# Patient Record
Sex: Male | Born: 1960 | Race: White | Hispanic: No | Marital: Married | Smoking: Never smoker
Health system: Southern US, Community
[De-identification: ages and names within clinical notes are randomized; demographics above are authoritative.]

---

## 2001-08-08 ENCOUNTER — Inpatient Hospital Stay (HOSPITAL_COMMUNITY): Admission: EM | Admit: 2001-08-08 | Discharge: 2001-08-10 | Payer: Self-pay | Admitting: Orthopedic Surgery

## 2004-12-23 ENCOUNTER — Ambulatory Visit: Payer: Self-pay | Admitting: Internal Medicine

## 2011-12-21 ENCOUNTER — Encounter: Payer: Self-pay | Admitting: Family Medicine

## 2011-12-21 ENCOUNTER — Ambulatory Visit (INDEPENDENT_AMBULATORY_CARE_PROVIDER_SITE_OTHER): Payer: BC Managed Care – PPO | Admitting: Family Medicine

## 2011-12-21 ENCOUNTER — Ambulatory Visit: Payer: BC Managed Care – PPO

## 2011-12-21 VITALS — BP 144/80 | HR 66 | Temp 98.1°F | Resp 16 | Ht 67.0 in | Wt 170.8 lb

## 2011-12-21 DIAGNOSIS — S93409A Sprain of unspecified ligament of unspecified ankle, initial encounter: Secondary | ICD-10-CM

## 2011-12-21 DIAGNOSIS — W19XXXA Unspecified fall, initial encounter: Secondary | ICD-10-CM

## 2011-12-21 DIAGNOSIS — M79671 Pain in right foot: Secondary | ICD-10-CM

## 2011-12-21 DIAGNOSIS — Z13228 Encounter for screening for other metabolic disorders: Secondary | ICD-10-CM

## 2011-12-21 DIAGNOSIS — Z23 Encounter for immunization: Secondary | ICD-10-CM

## 2011-12-21 DIAGNOSIS — Z130 Encounter for screening for diseases of the blood and blood-forming organs and certain disorders involving the immune mechanism: Secondary | ICD-10-CM

## 2011-12-21 DIAGNOSIS — M79609 Pain in unspecified limb: Secondary | ICD-10-CM

## 2011-12-21 NOTE — Progress Notes (Signed)
  Patient Name: Jordan Stout Date of Birth: 12-30-1960 Medical Record Number: [id number] Gender: male Date of Encounter: 12/21/2011  History of Present Illness:  Jordan Stout is a 51 y.o. very pleasant male patient who presents with the following:  Was going down his stairs on Saturday (today is Monday) when he tripped on his dog and fell 1/2 way down the flight of stairs.  Missed a step and injured his right foot.  Also banged and scraped his right arm but that is ok now.  No head injury.  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  No past medical history on file. No past surgical history on file. History  Substance Use Topics  . Smoking status: Never Smoker   . Smokeless tobacco: Not on file  . Alcohol Use: Not on file   No family history on file. No Known Allergies  Medication list has been reviewed and updated.  Review of Systems: As per HPI- otherwise negative.  Physical Examination: Filed Vitals:   12/21/11 1050  BP: 144/80  Pulse: 66  Temp: 98.1 F (36.7 C)  TempSrc: Oral  Resp: 16  Height: 5\' 7"  (1.702 m)  Weight: 170 lb 12.8 oz (77.474 kg)    Body mass index is 26.75 kg/(m^2).   GEN: WDWN, NAD, Non-toxic, Alert & Oriented x 3 HEENT: Atraumatic, Normocephalic.  Ears and Nose: No external deformity. EXTR: No clubbing/cyanosis/edema NEURO: Normal gait.  PSYCH: Normally interactive. Conversant. Not depressed or anxious appearing.  Calm demeanor.  Right arm- abrasion, otherwise negative Right ankle/ foot: tender and lateral epicondyle and at lateral foot, normal cap refill, ROM and sensation, normal DP pulse.  Some swelling but no bruising over tender area.   [facility] reading (PRIMARY) by  Dr. Patsy Lager.  Right foot: negative Right ankle: bony growth of  of tibia, but no acute fracture or other injury  Assessment and Plan: 1. Fall  DG Ankle Complete Right, DG Foot Complete Right  2. Need for diphtheria-tetanus-pertussis (Tdap) vaccine, adult/adolescent  Tdap vaccine greater than or equal to 7yo IM  3. Foot pain, right    4. Ankle sprain      Gave note for work- he plans to try and return on 3/21, but will let us know if this is not possible.  Use CAM as needed for pain with walking, elevate and ice ankle.  Let us know if not better within the next few days

## 2011-12-23 ENCOUNTER — Telehealth: Payer: Self-pay

## 2011-12-23 NOTE — Telephone Encounter (Signed)
pts foot is black and blue - needs oow note from Dr Patsy Lager

## 2012-03-22 ENCOUNTER — Other Ambulatory Visit: Payer: Self-pay | Admitting: Family Medicine

## 2012-05-25 ENCOUNTER — Other Ambulatory Visit: Payer: Self-pay | Admitting: Physician Assistant

## 2012-12-02 ENCOUNTER — Encounter: Payer: Self-pay | Admitting: Internal Medicine

## 2013-01-18 ENCOUNTER — Encounter: Payer: Self-pay | Admitting: Internal Medicine

## 2013-04-18 ENCOUNTER — Encounter: Payer: Self-pay | Admitting: Internal Medicine

## 2014-10-06 ENCOUNTER — Ambulatory Visit (INDEPENDENT_AMBULATORY_CARE_PROVIDER_SITE_OTHER): Payer: BLUE CROSS/BLUE SHIELD | Admitting: Family Medicine

## 2014-10-06 ENCOUNTER — Encounter: Payer: Self-pay | Admitting: Family Medicine

## 2014-10-06 VITALS — BP 132/78 | HR 68 | Temp 97.9°F | Ht 67.0 in | Wt 166.6 lb

## 2014-10-06 DIAGNOSIS — J029 Acute pharyngitis, unspecified: Secondary | ICD-10-CM

## 2014-10-06 DIAGNOSIS — J020 Streptococcal pharyngitis: Secondary | ICD-10-CM

## 2014-10-06 LAB — POCT RAPID STREP A (OFFICE): RAPID STREP A SCREEN: NEGATIVE

## 2014-10-06 NOTE — Addendum Note (Signed)
Addended by: Bronson Curb on: 10/06/2014 09:15 AM   Modules accepted: Orders

## 2014-10-06 NOTE — Progress Notes (Addendum)
[facility] [address], [place] [address] [phone]  Date:  10/06/2014   Name:  Jordan Stout   DOB:  1961-05-31   MRN:  [id number]  PCP:  Tally Due, MD    Chief Complaint: Sore Throat   History of Present Illness:  Jordan Stout is a 54 y.o. very pleasant male patient who presents with the following:  Here with his daughter who was dx with strep throat today.  He would like to be tested as well.  He also had noted a ST over the last few days, although he now seems to be improving. He is not aware of any fever, but he did "sweat it out" and felt achy and ill at bedtime for a couple of days earlier this week.  Overall now he feels ok.   He is otherwise generally healthy  There are no active problems to display for this patient.   History reviewed. No pertinent past medical history.  History reviewed. No pertinent past surgical history.  History  Substance Use Topics  . Smoking status: Never Smoker   . Smokeless tobacco: Former Neurosurgeon  . Alcohol Use: Yes     Comment: ocasionall    Family History  Problem Relation Age of Onset  . Heart disease Mother   . Hypertension Father     No Known Allergies  Medication list has been reviewed and updated.  Current Outpatient Prescriptions on File Prior to Visit  Medication Sig Dispense Refill  . lisinopril-hydrochlorothiazide (PRINZIDE,ZESTORETIC) 20-12.5 MG per tablet TAKE 1 TABLET BY MOUTH DAILY. NEEDS OFFICE VISIT/LABS FOR MORE 15 tablet 0   No current facility-administered medications on file prior to visit.    Review of Systems:  As per HPI- otherwise negative.   Physical Examination: Filed Vitals:   10/06/14 0854  BP: 132/78  Pulse: 68  Temp: 97.9 F (36.6 C)   Filed Vitals:   10/06/14 0854  Height:  (1.702 m)  Weight: 166 lb 9.6 oz (75.569 kg)   Body mass index is 26.09 kg/(m^2). Ideal Body Weight: Weight in (lb) to have BMI = 25: 159.3  GEN: WDWN, NAD,  Non-toxic, A & O x 3, looks well HEENT: Atraumatic, Normocephalic. Neck supple. No masses, No LAD.  Bilateral TM wnl, oropharynx normal.  PEERL,EOMI.   Ears and Nose: No external deformity. CV: RRR, No M/G/R. No JVD. No thrill. No extra heart sounds. PULM: CTA B, no wheezes, crackles, rhonchi. No retractions. No resp. distress. No accessory muscle use. EXTR: No c/c/e NEURO Normal gait.  PSYCH: Normally interactive. Conversant. Not depressed or anxious appearing.  Calm demeanor.   Results for orders placed or performed in visit on 10/06/14  POCT rapid strep A  Result Value Ref Range   Rapid Strep A Screen Negative Negative    Assessment and Plan: Acute pharyngitis, unspecified pharyngitis type - Plan: POCT rapid strep A  Exposed to strep through his daughter- however he is currently negative and does not have a current ST Await culture  Signed Abbe Amsterdam, MD  Called 1/3- had to [facility].   throat culture was positive after all.  Will send PCN to his drug store.  Let me know if not feeling better!

## 2014-10-06 NOTE — Patient Instructions (Signed)
I will be in touch with your culture- if positive we will call in penicillin for you but otherwise it appear that you are in the clear.  Let me know if you have any further symptoms!

## 2014-10-07 ENCOUNTER — Encounter: Payer: Self-pay | Admitting: Family Medicine

## 2014-10-07 LAB — CULTURE, GROUP A STREP

## 2014-10-07 MED ORDER — PENICILLIN V POTASSIUM 500 MG PO TABS: 500.0000 mg | ORAL_TABLET | Freq: Two times a day (BID) | ORAL | Status: AC

## 2014-10-07 NOTE — Addendum Note (Signed)
Addended by: Abbe Amsterdam C on: 10/07/2014 05:51 PM   Modules accepted: Orders

## 2017-12-14 ENCOUNTER — Ambulatory Visit (INDEPENDENT_AMBULATORY_CARE_PROVIDER_SITE_OTHER): Payer: BLUE CROSS/BLUE SHIELD | Admitting: Orthopaedic Surgery

## 2017-12-14 ENCOUNTER — Ambulatory Visit (INDEPENDENT_AMBULATORY_CARE_PROVIDER_SITE_OTHER): Payer: BLUE CROSS/BLUE SHIELD

## 2017-12-14 ENCOUNTER — Encounter (INDEPENDENT_AMBULATORY_CARE_PROVIDER_SITE_OTHER): Payer: Self-pay | Admitting: Orthopaedic Surgery

## 2017-12-14 DIAGNOSIS — M25552 Pain in left hip: Secondary | ICD-10-CM | POA: Diagnosis not present

## 2017-12-14 NOTE — Progress Notes (Signed)
   Office Visit Note   Patient: Jordan Stout           Date of Birth: Feb 03, 1961           MRN: [id number] Visit Date: 12/14/2017              Requested by: Jonita AlbeeGuest, Chris W, MD No address on file PCP: Jonita AlbeeGuest, Chris W, MD   Assessment & Plan: Visit Diagnoses:  1. 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  At this point, we feel it is appropriate to refer him to Dr. Alvester MorinNewton for an intra-articular cortisone injection.  He will follow-up with us on an as-needed basis.  He will call with concerns or questions in the meantime.  Follow-Up Instructions: Return if symptoms worsen or fail to improve.   Orders:  Orders Placed This Encounter  Procedures  . XR HIP UNILAT W OR W/O PELVIS 2-3 VIEWS LEFT  . Ambulatory referral to Physical Medicine Rehab   No orders of the defined types were placed in this encounter.     Procedures: No procedures performed   Clinical Data: No additional findings.   Subjective: Chief Complaint  Patient presents with  . Left Hip - Pain    HPI Jordan HuaDavid is a pleasant 57 year old gentleman who presents to our clinic today with left hip pain.  This began about a month and a half ago without any known injury or change in activity.  All of his pain is to the groin.  No anterior thigh or posterior leg pain.  No pain over the greater trochanter.  He describes the pain is sharp and shooting in nature and only occurs intermittently.  Thinks that bring this on when he turns certain ways or crosses his legs.  No numbness tingling burning.  Review of Systems as detailed in HPI.  All others reviewed and are negative.   Objective: Vital Signs: There were no vitals taken for this visit.  Physical Exam well-developed well-nourished gentleman in no acute distress.  Alert and oriented x3.  Ortho Exam examination of his left hip reveals negative straight leg raise, positive logroll and markedly positive Pearlean BrownieFaber.  He is  neurovascular intact distally.  Specialty Comments:  No specialty comments available.  Imaging: Xr Hip Unilat W Or W/o Pelvis 2-3 Views Left  Result Date: 12/14/2017 X-rays of the left hip reveal minimal degenerative changes    PMFS History: There are no active problems to display for this patient.  History reviewed. No pertinent past medical history.  Family History  Problem Relation Age of Onset  . Heart disease Mother   . Hypertension Father     History reviewed. No pertinent surgical history. Social History   Occupational History  . Not on file  Tobacco Use  . Smoking status: Never Smoker  . Smokeless tobacco: Former Engineer, waterUser  Substance and Sexual Activity  . Alcohol use: Yes    Comment: ocasionall  . Drug use: No  . Sexual activity: Not on file

## 2018-01-06 ENCOUNTER — Encounter (INDEPENDENT_AMBULATORY_CARE_PROVIDER_SITE_OTHER): Payer: Self-pay | Admitting: Physical Medicine and Rehabilitation

## 2018-01-06 ENCOUNTER — Ambulatory Visit (INDEPENDENT_AMBULATORY_CARE_PROVIDER_SITE_OTHER): Payer: Self-pay

## 2018-01-06 ENCOUNTER — Ambulatory Visit (INDEPENDENT_AMBULATORY_CARE_PROVIDER_SITE_OTHER): Payer: BLUE CROSS/BLUE SHIELD | Admitting: Physical Medicine and Rehabilitation

## 2018-01-06 DIAGNOSIS — M25552 Pain in left hip: Secondary | ICD-10-CM | POA: Diagnosis not present

## 2018-01-06 MED ORDER — BUPIVACAINE HCL 0.5 % IJ SOLN
3.0000 mL | INTRAMUSCULAR | Status: AC | PRN
  Administered 2018-01-06: 3 mL via INTRA_ARTICULAR

## 2018-01-06 MED ORDER — TRIAMCINOLONE ACETONIDE 40 MG/ML IJ SUSP
80.0000 mg | INTRAMUSCULAR | Status: AC | PRN
  Administered 2018-01-06: 80 mg via INTRA_ARTICULAR

## 2018-01-06 NOTE — Patient Instructions (Signed)

## 2018-01-06 NOTE — Progress Notes (Signed)
   Jordan EtienneDavid L Kingbird - 57 y.o. male MRN [id number]  Date of birth: 10/03/1961  Office Visit Note: Visit Date: 01/06/2018 PCP: Jonita AlbeeGuest, Chris W, MD Referred by: Jonita AlbeeGuest, Chris W, MD  Subjective: Chief Complaint  Patient presents with  . Left Hip - Pain   HPI: Mr. Jordan Stout is a 57 year old gentleman with chronic worsening left hip and groin pain particularly in the left buttock and left lateral hip but also in the anterior part with certain movements.  He reports this started about 2 months ago without specific injury.  He states the pain can come and go some more night and is really dependent on the way he steps.  He uses heating pad and medications with only mild relief.  He is referred by Dr. Roda ShuttersXu for diagnostic and therapeutic anesthetic hip arthrogram.   ROS Otherwise per HPI.  Assessment & Plan: Visit Diagnoses:  1. Pain in left hip     Plan: Findings:  Diagnostic and therapeutic anesthetic hip arthrogram.  Patient seemed to have some relief anesthetic phase of the injection.  He had increased range of motion.    Meds & Orders: No orders of the defined types were placed in this encounter.  No orders of the defined types were placed in this encounter.   Follow-up: No follow-ups on file.   Procedures: Large Joint Inj: L hip joint on 01/06/2018 1:44 PM Indications: pain and diagnostic evaluation Details: 22 G needle, anterior approach  Arthrogram: Yes  Medications: 80 mg triamcinolone acetonide 40 MG/ML; 3 mL bupivacaine 0.5 % Outcome: tolerated well, no immediate complications  Arthrogram demonstrated excellent flow of contrast throughout the joint surface without extravasation or obvious defect.  The patient had relief of symptoms during the anesthetic phase of the injection.  Procedure, treatment alternatives, risks and benefits explained, specific risks discussed. Consent was given by the patient. Immediately prior to procedure a time out was called to verify the correct patient,  procedure, equipment, support staff and site/side marked as required. Patient was prepped and draped in the usual sterile fashion.      No notes on file   Clinical History: No specialty comments available.   He reports that he has never smoked. He has quit using smokeless tobacco. No results for input(s): HGBA1C, LABURIC in the last 8760 hours.  Objective:  VS:  HT:    WT:   BMI:     BP:   HR: bpm  TEMP: ( )  RESP:  Physical Exam  Ortho Exam Imaging: No results found.  Past Medical/Family/Surgical/Social History: Medications & Allergies reviewed per EMR, new medications updated. There are no active problems to display for this patient.  History reviewed. No pertinent past medical history. Family History  Problem Relation Age of Onset  . Heart disease Mother   . Hypertension Father    History reviewed. No pertinent surgical history. Social History   Occupational History  . Not on file  Tobacco Use  . Smoking status: Never Smoker  . Smokeless tobacco: Former Engineer, waterUser  Substance and Sexual Activity  . Alcohol use: Yes    Comment: ocasionall  . Drug use: No  . Sexual activity: Not on file

## 2018-01-06 NOTE — Progress Notes (Signed)
 .  Numeric Pain Rating Scale and Functional Assessment Average Pain 8   In the last MONTH (on 0-10 scale) has pain interfered with the following?  1. General activity like being  able to carry out your everyday physical activities such as walking, climbing stairs, carrying groceries, or moving a chair?  Rating(4)   +Driver, -BT, -Dye Allergies.  

## 2018-03-23 ENCOUNTER — Telehealth (INDEPENDENT_AMBULATORY_CARE_PROVIDER_SITE_OTHER): Payer: Self-pay | Admitting: Physician Assistant

## 2018-03-23 NOTE — Telephone Encounter (Signed)
[facility] [phone]

## 2019-04-26 ENCOUNTER — Ambulatory Visit (HOSPITAL_COMMUNITY)
Admission: RE | Admit: 2019-04-26 | Discharge: 2019-04-26 | Disposition: A | Discharge status: Home / Self Care | Payer: BC Managed Care – PPO | Source: Ambulatory Visit | Attending: Family | Admitting: Family

## 2019-04-26 ENCOUNTER — Other Ambulatory Visit: Payer: Self-pay

## 2019-04-26 ENCOUNTER — Telehealth (HOSPITAL_COMMUNITY): Payer: Self-pay | Admitting: Rehabilitation

## 2019-04-26 ENCOUNTER — Other Ambulatory Visit (HOSPITAL_COMMUNITY): Payer: Self-pay | Admitting: Internal Medicine

## 2019-04-26 DIAGNOSIS — M79605 Pain in left leg: Secondary | ICD-10-CM | POA: Diagnosis present

## 2019-04-26 NOTE — Telephone Encounter (Signed)

## 2019-06-15 ENCOUNTER — Other Ambulatory Visit: Payer: Self-pay | Admitting: Internal Medicine

## 2019-06-15 DIAGNOSIS — E785 Hyperlipidemia, unspecified: Secondary | ICD-10-CM

## 2019-06-20 ENCOUNTER — Telehealth (HOSPITAL_COMMUNITY): Payer: Self-pay | Admitting: *Deleted

## 2019-06-20 NOTE — Telephone Encounter (Signed)
Let VM for pt to schedule appt. EM

## 2019-07-06 ENCOUNTER — Other Ambulatory Visit: Payer: BC Managed Care – PPO

## 2019-07-13 ENCOUNTER — Other Ambulatory Visit: Payer: BC Managed Care – PPO

## 2020-02-06 ENCOUNTER — Other Ambulatory Visit (HOSPITAL_COMMUNITY): Payer: Self-pay | Admitting: Internal Medicine

## 2020-02-06 ENCOUNTER — Ambulatory Visit (HOSPITAL_COMMUNITY)
Admission: RE | Admit: 2020-02-06 | Discharge: 2020-02-06 | Disposition: A | Discharge status: Home / Self Care | Payer: BC Managed Care – PPO | Source: Ambulatory Visit | Attending: Surgery | Admitting: Surgery

## 2020-02-06 ENCOUNTER — Other Ambulatory Visit: Payer: Self-pay

## 2020-02-06 ENCOUNTER — Telehealth (HOSPITAL_COMMUNITY): Payer: Self-pay | Admitting: *Deleted

## 2020-02-06 DIAGNOSIS — M79605 Pain in left leg: Secondary | ICD-10-CM | POA: Insufficient documentation

## 2020-02-06 NOTE — Telephone Encounter (Signed)
Office notified of results, patient instructed by office to return following exam.

## 2020-03-19 ENCOUNTER — Encounter: Payer: Self-pay | Admitting: Vascular Surgery

## 2020-03-19 ENCOUNTER — Other Ambulatory Visit: Payer: Self-pay

## 2020-03-19 ENCOUNTER — Ambulatory Visit: Payer: BC Managed Care – PPO | Admitting: Vascular Surgery

## 2020-03-19 VITALS — BP 154/95 | HR 78 | Temp 98.3°F | Resp 20 | Ht 67.0 in | Wt 181.8 lb

## 2020-03-19 DIAGNOSIS — I82402 Acute embolism and thrombosis of unspecified deep veins of left lower extremity: Secondary | ICD-10-CM

## 2020-03-19 NOTE — Progress Notes (Signed)
[facility]  Patient name: Jordan Stout MRN: [id number] DOB: Sep 03, 1961 Sex: male  REASON FOR CONSULT: Discuss the duplex findings of left leg DVT  HPI: Jordan Stout is a 59 y.o. male, who is here today for discussion of left leg DVT.  He initially had an episode of superficial thrombophlebitis involving left great saphenous vein in his proximal thigh.  He had soreness and swelling over this area and had a duplex which revealed great saphenous vein thrombus that extended up to between 2 and 3 cm from the saphenofemoral junction.  Reports that he was placed on Eliquis for approximately 45 days and then discontinued.  This was in July 2020.  Recently he was having symptoms again in his left hip and thigh and had repeat duplex for further evaluation.  His symptoms do not sound related to venous disease.  He reports that he will have days when he has no issues whatsoever and then will have a stabbing pain in his lateral hip and this extends around into his anterior thigh.  He reports that he will have discomfort for several days following this and then this resolves.  He specifically denies any other history of DVT and does not have any lower extremity swelling.  History reviewed. No pertinent past medical history.  Family History  Problem Relation Age of Onset  . Heart disease Mother   . Hypertension Father     SOCIAL HISTORY: Social History   Socioeconomic History  . Marital status: Single    Spouse name: Not on file  . Number of children: Not on file  . Years of education: Not on file  . Highest education level: Not on file  Occupational History  . Not on file  Tobacco Use  . Smoking status: Never Smoker  . Smokeless tobacco: Current User    Types: Snuff  Substance and Sexual Activity  . Alcohol use: Yes    Comment: ocasionall  . Drug use: No  . Sexual activity: Not on file  Other Topics Concern  . Not on  file  Social History Narrative  . Not on file   Social Determinants of Health   Financial Resource Strain:   . Difficulty of Paying Living Expenses:   Food Insecurity:   . Worried About Programme researcher, broadcasting/film/video in the Last Year:   . Barista in the Last Year:   Transportation Needs:   . Freight forwarder (Medical):   Marland Kitchen Lack of Transportation (Non-Medical):   Physical Activity:   . Days of Exercise per Week:   . Minutes of Exercise per Session:   Stress:   . Feeling of Stress :   Social Connections:   . Frequency of Communication with Friends and Family:   . Frequency of Social Gatherings with Friends and Family:   . Attends Religious Services:   . Active Member of Clubs or Organizations:   . Attends Banker Meetings:   Marland Kitchen Marital Status:   Intimate Partner Violence:   . Fear of Current or Ex-Partner:   . Emotionally Abused:   Marland Kitchen Physically Abused:   . Sexually Abused:     No Known Allergies  Current Outpatient Medications  Medication Sig Dispense Refill  . Ibuprofen 200 MG CAPS Take by mouth.    Marland Kitchen lisinopril-hydrochlorothiazide (PRINZIDE,ZESTORETIC) 20-12.5 MG per tablet TAKE 1 TABLET BY MOUTH DAILY. NEEDS OFFICE VISIT/LABS FOR MORE 15 tablet 0  . penicillin v potassium (  VEETID) 500 MG tablet Take 1 tablet (500 mg total) by mouth 2 (two) times daily. (Patient not taking: Reported on 01/06/2018) 20 tablet 0   No current facility-administered medications for this visit.    REVIEW OF SYSTEMS:  [X]  denotes positive finding, [ ]  denotes negative finding Cardiac  Comments:  Chest pain or chest pressure:    Shortness of breath upon exertion:    Short of breath when lying flat:    Irregular heart rhythm:        Vascular    Pain in calf, thigh, or hip brought on by ambulation: x   Pain in feet at night that wakes you up from your sleep:     Blood clot in your veins:    Leg swelling:         Pulmonary    Oxygen at home:    Productive cough:       Wheezing:         Neurologic    Sudden weakness in arms or legs:     Sudden numbness in arms or legs:     Sudden onset of difficulty speaking or slurred speech:    Temporary loss of vision in one eye:     Problems with dizziness:         Gastrointestinal    Blood in stool:     Vomited blood:         Genitourinary    Burning when urinating:     Blood in urine:        Psychiatric    Major depression:         Hematologic    Bleeding problems:    Problems with blood clotting too easily:        Skin    Rashes or ulcers:        Constitutional    Fever or chills:      PHYSICAL EXAM: Vitals:   03/19/20 1016  BP: (!) 154/95  Pulse: 78  Resp: 20  Temp: 98.3 F (36.8 C)  TempSrc: Temporal  SpO2: 99%  Weight: 181 lb 12.8 oz (82.5 kg)  Height: 5\' 7"  (1.702 m)    GENERAL: The patient is a well-nourished male, in no acute distress. The vital signs are documented above. CARDIOVASCULAR: 2+ radial and 2+ dorsalis pedis pulses bilaterally.  No swelling bilaterally. PULMONARY: There is good air exchange  ABDOMEN: Soft and non-tender  MUSCULOSKELETAL: There are no major deformities or cyanosis. NEUROLOGIC: No focal weakness or paresthesias are detected. SKIN: There are no ulcers or rashes noted.  Does have some distal cyanosis in his feet which he reports is chronic PSYCHIATRIC: The patient has a normal affect.  DATA:  Duplex was reviewed from 04/2019 and also from May of this year.  He did not have any evidence of DVT by duplex in 2020.  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  MEDICAL ISSUES: I discussed this at length with the patient.  I suspect that he did have some nonocclusive thrombus at the femoral vein near the saphenofemoral junction when he had the superficial great saphenous vein thrombus in his proximal thigh.  He does not have any evidence of swelling or evidence of recurrent  thrombosis.  I would not recommend long-term anticoagulation.  Would not recommend any further hypercoagulable work-up with a single episode.  I do not feel that his left hip symptoms correlate with venous  pathology more likely primarily hip or even potentially lumbar disc disease.  He was reassured with this discussion will see [place] again on an as-needed basis   Larina Earthly, MD [facility] Tel [phone] Pager [phone]

## 2020-08-14 ENCOUNTER — Other Ambulatory Visit: Payer: Self-pay | Admitting: Internal Medicine

## 2020-08-14 DIAGNOSIS — E785 Hyperlipidemia, unspecified: Secondary | ICD-10-CM

## 2021-01-26 ENCOUNTER — Emergency Department (HOSPITAL_COMMUNITY)
Admission: EM | Admit: 2021-01-26 | Discharge: 2021-01-27 | Disposition: A | Discharge status: Home / Self Care | Payer: BC Managed Care – PPO | Attending: Emergency Medicine | Admitting: Emergency Medicine

## 2021-01-26 ENCOUNTER — Emergency Department (HOSPITAL_COMMUNITY): Payer: BC Managed Care – PPO

## 2021-01-26 DIAGNOSIS — F17220 Nicotine dependence, chewing tobacco, uncomplicated: Secondary | ICD-10-CM | POA: Insufficient documentation

## 2021-01-26 DIAGNOSIS — W19XXXA Unspecified fall, initial encounter: Secondary | ICD-10-CM

## 2021-01-26 DIAGNOSIS — Y92838 Other recreation area as the place of occurrence of the external cause: Secondary | ICD-10-CM | POA: Diagnosis not present

## 2021-01-26 DIAGNOSIS — S01111A Laceration without foreign body of right eyelid and periocular area, initial encounter: Secondary | ICD-10-CM | POA: Diagnosis not present

## 2021-01-26 DIAGNOSIS — S0101XA Laceration without foreign body of scalp, initial encounter: Secondary | ICD-10-CM | POA: Insufficient documentation

## 2021-01-26 DIAGNOSIS — F10920 Alcohol use, unspecified with intoxication, uncomplicated: Secondary | ICD-10-CM

## 2021-01-26 DIAGNOSIS — W108XXA Fall (on) (from) other stairs and steps, initial encounter: Secondary | ICD-10-CM | POA: Insufficient documentation

## 2021-01-26 DIAGNOSIS — S0990XA Unspecified injury of head, initial encounter: Secondary | ICD-10-CM | POA: Diagnosis present

## 2021-01-26 DIAGNOSIS — F10929 Alcohol use, unspecified with intoxication, unspecified: Secondary | ICD-10-CM | POA: Diagnosis not present

## 2021-01-26 MED ORDER — TETANUS-DIPHTH-ACELL PERTUSSIS 5-2.5-18.5 LF-MCG/0.5 IM SUSY: 0.5000 mL | PREFILLED_SYRINGE | Freq: Once | INTRAMUSCULAR | Status: DC

## 2021-01-26 MED ORDER — LIDOCAINE-EPINEPHRINE (PF) 2 %-1:200000 IJ SOLN
20.0000 mL | Freq: Once | INTRAMUSCULAR | Status: AC
  Administered 2021-01-26: 20 mL
  Filled 2021-01-26: qty 20

## 2021-01-26 NOTE — ED Triage Notes (Signed)
Pt BIB EMS from food truck festival. Admits to drinking 8 beers. Pt fell down 3 steps at festival. No LOC. C/o pain to top of head. Small laceration noted to R side of forehead and laceration noted to top of head. Pt not on blood thinners. Takes lisinopril. A&O x4  EMS vitals: HR 90 BP 164/102 97% RA CBG 107   18G IV in R AC

## 2021-01-26 NOTE — ED Provider Notes (Signed)
[facility] EMERGENCY DEPARTMENT Provider Note   CSN: [id number] Arrival date & time: 01/26/21  2200     History No chief complaint on file.   Jordan Stout is a 60 y.o. male.  Patient presents to the emergency department with a chief complaint of fall and head injury.  He is intoxicated.  He states that he was at a bar and had 8 beers.  He missed a step and hit his head on the stairs.  He sustained a large scalp laceration and small right eyebrow laceration.  Unknown loss of consciousness.  He is not anticoagulated.  He denies being in any pain elsewhere.  The history is provided by the patient. No language interpreter was used.       No past medical history on file.  There are no problems to display for this patient.   No past surgical history on file.     Family History  Problem Relation Age of Onset  . Heart disease Mother   . Hypertension Father     Social History   Tobacco Use  . Smoking status: Never Smoker  . Smokeless tobacco: Current User    Types: Snuff  Substance Use Topics  . Alcohol use: Yes    Comment: ocasionall  . Drug use: No    Home Medications Prior to Admission medications   Medication Sig Start Date End Date Taking? Authorizing Provider  Ibuprofen 200 MG CAPS Take by mouth. 09/14/16   [provider]  lisinopril-hydrochlorothiazide (PRINZIDE,ZESTORETIC) 20-12.5 MG per tablet TAKE 1 TABLET BY MOUTH DAILY. NEEDS OFFICE VISIT/LABS FOR MORE 05/25/12   Rhoderick Moody M, PA-C  penicillin v potassium (VEETID) 500 MG tablet Take 1 tablet (500 mg total) by mouth 2 (two) times daily. Patient not taking: Reported on 01/06/2018 10/07/14   Copland, Gwenlyn Found, MD    Allergies    Patient has no known allergies.  Review of Systems   Review of Systems  All other systems reviewed and are negative.   Physical Exam Updated Vital Signs BP (!) 146/94 (BP Location: Right Arm)   Pulse 87   Temp 97.6 F (36.4 C) (Oral)   Resp  18   Ht 5\' 7"  (1.702 m)   Wt 81.6 kg   SpO2 97%   BMI 28.19 kg/m   Physical Exam Vitals and nursing note reviewed.  Constitutional:      Appearance: He is well-developed.  HENT:     Head: Normocephalic and atraumatic.     Comments: 6 cm semi circular laceration to the scalp 3 cm stellate laceration to right temple Eyes:     Conjunctiva/sclera: Conjunctivae normal.  Cardiovascular:     Rate and Rhythm: Normal rate and regular rhythm.     Heart sounds: No murmur heard.   Pulmonary:     Effort: Pulmonary effort is normal. No respiratory distress.     Breath sounds: Normal breath sounds.  Abdominal:     Palpations: Abdomen is soft.     Tenderness: There is no abdominal tenderness.  Musculoskeletal:        General: Normal range of motion.     Cervical back: Neck supple.     Comments: Moves all extremities  Skin:    General: Skin is warm and dry.  Neurological:     Mental Status: He is alert and oriented to person, place, and time.     Comments: CN 3-12 intact, speech is clear, movements are goal oriented, equal grip strength  bilaterally  Psychiatric:        Mood and Affect: Mood normal.        Behavior: Behavior normal.     ED Results / Procedures / Treatments   Labs (all labs ordered are listed, but only abnormal results are displayed) Labs Reviewed - No data to display  EKG None  Radiology No results found.  Procedures .Marland KitchenLaceration Repair  Date/Time: 01/27/2021 12:10 AM Performed by: Roxy Horseman, PA-C Authorized by: Roxy Horseman, PA-C   Consent:    Consent obtained:  Verbal   Consent given by:  Patient   Risks discussed:  Infection, need for additional repair, pain, poor cosmetic result and poor wound healing   Alternatives discussed:  No treatment and delayed treatment Universal protocol:    Procedure explained and questions answered to patient or proxy's satisfaction: yes     Relevant documents present and verified: yes     Test results  available: yes     Imaging studies available: yes     Required blood products, implants, devices, and special equipment available: yes     Site/side marked: yes     Immediately prior to procedure, a time out was called: yes     Patient identity confirmed:  Verbally with patient Anesthesia:    Anesthesia method:  Local infiltration   Local anesthetic:  Lidocaine 1% WITH epi Laceration details:    Location:  Scalp   Scalp location:  R temporal   Length (cm):  3 Pre-procedure details:    Preparation:  Patient was prepped and draped in usual sterile fashion and imaging obtained to evaluate for foreign bodies Exploration:    Wound exploration: wound explored through full range of motion and entire depth of wound visualized     Contaminated: no   Treatment:    Area cleansed with:  Saline   Amount of cleaning:  Standard   Irrigation solution:  Sterile water and sterile saline   Irrigation method:  Syringe   Debridement:  None Skin repair:    Repair method:  Sutures   Suture size:  5-0   Suture material:  Prolene   Number of sutures:  5 Approximation:    Approximation:  Close Repair type:    Repair type:  Simple Post-procedure details:    Dressing:  Open (no dressing)   Procedure completion:  Tolerated well, no immediate complications .Marland KitchenLaceration Repair  Date/Time: 01/27/2021 12:11 AM Performed by: Roxy Horseman, PA-C Authorized by: Roxy Horseman, PA-C   Consent:    Consent obtained:  Verbal   Consent given by:  Patient   Risks discussed:  Infection, need for additional repair, pain, poor cosmetic result and poor wound healing   Alternatives discussed:  No treatment and delayed treatment Universal protocol:    Procedure explained and questions answered to patient or proxy's satisfaction: yes     Relevant documents present and verified: yes     Test results available: yes     Imaging studies available: yes     Required blood products, implants, devices, and special  equipment available: yes     Site/side marked: yes     Immediately prior to procedure, a time out was called: yes     Patient identity confirmed:  Verbally with patient Anesthesia:    Anesthesia method:  Local infiltration Laceration details:    Location:  Scalp   Length (cm):  6   Laceration depth: full thickness. Pre-procedure details:    Preparation:  Patient was prepped and draped  in usual sterile fashion Exploration:    Wound exploration: wound explored through full range of motion and entire depth of wound visualized   Treatment:    Area cleansed with:  Saline   Amount of cleaning:  Standard   Irrigation solution:  Sterile saline   Irrigation method:  Syringe   Layers/structures repaired:  Vernona Rieger:    Suture size:  5-0   Suture material:  Plain gut   Suture technique:  Simple interrupted   Number of sutures:  4 Skin repair:    Repair method:  Staples   Number of staples:  19 Approximation:    Approximation:  Close Repair type:    Repair type:  Intermediate Post-procedure details:    Dressing:  Open (no dressing)   Procedure completion:  Tolerated well, no immediate complications     Medications Ordered in ED Medications  lidocaine-EPINEPHrine (XYLOCAINE W/EPI) 2 %-1:200000 (PF) injection 20 mL (has no administration in time range)    ED Course  I have reviewed the triage vital signs and the nursing notes.  Pertinent labs & imaging results that were available during my care of the patient were reviewed by me and considered in my medical decision making (see chart for details).    MDM Rules/Calculators/A&P                          Patient here with scalp laceration.  He is intoxicated and fell and hit his head on a step.  He does not recall passing out.  CT cervical spine and head are negative for intracranial process or skull fracture or cervical spine fracture.  Due to intoxication, patient is placed in a [place] J collar and instructed to follow-up with  his PCP.  Patient did have a deep scalp laceration and a minor forehead laceration which were repaired, see notes above.  Tetanus shot is current.  Patient moves all extremities.  He is neurovascularly intact.  He ambulates to the bathroom with a steady gait.   Final Clinical Impression(s) / ED Diagnoses Final diagnoses:  Fall, initial encounter  Laceration of scalp, initial encounter  Alcoholic intoxication without complication [facility])    Rx / DC Orders ED Discharge Orders    None       Roxy Horseman, PA-C 01/27/21 0016    Cheryll Cockayne, MD 01/27/21 [phone]

## 2021-01-27 NOTE — ED Notes (Signed)
E-signature pad unavailable at time of pt discharge. This RN discussed discharge materials with pt and answered all pt questions. Pt stated understanding of discharge material. ? ?

## 2021-01-27 NOTE — Discharge Instructions (Addendum)
You need to follow-up with your doctor in 5 days for suture removal.  You need to follow-up with your doctor in 10-14 days for staple removal.  You should keep the soft collar on until you see your doctor in 5 days.   Keep the wounds clean and dry for the first 24 hours.  After that, you can shower, but not bathe.  I don't want you to soak the wound under water, but showering is ok.    If you have redness or pus that forms, you will need to return to the ER.

## 2021-08-18 ENCOUNTER — Other Ambulatory Visit (HOSPITAL_COMMUNITY): Payer: Self-pay | Admitting: Internal Medicine

## 2021-08-18 ENCOUNTER — Other Ambulatory Visit: Payer: Self-pay | Admitting: Internal Medicine

## 2021-08-18 DIAGNOSIS — M545 Low back pain, unspecified: Secondary | ICD-10-CM

## 2021-08-18 DIAGNOSIS — M546 Pain in thoracic spine: Secondary | ICD-10-CM

## 2021-08-20 ENCOUNTER — Other Ambulatory Visit: Payer: Self-pay

## 2021-08-20 ENCOUNTER — Ambulatory Visit (HOSPITAL_COMMUNITY)
Admission: RE | Admit: 2021-08-20 | Discharge: 2021-08-20 | Disposition: A | Discharge status: Home / Self Care | Payer: BC Managed Care – PPO | Source: Ambulatory Visit | Attending: Internal Medicine | Admitting: Internal Medicine

## 2021-08-20 DIAGNOSIS — M545 Low back pain, unspecified: Secondary | ICD-10-CM | POA: Diagnosis present

## 2021-08-20 DIAGNOSIS — M546 Pain in thoracic spine: Secondary | ICD-10-CM | POA: Diagnosis not present

## 2021-08-20 MED ORDER — GADOBUTROL 1 MMOL/ML IV SOLN
8.5000 mL | Freq: Once | INTRAVENOUS | Status: AC | PRN
  Administered 2021-08-20: 8.5 mL via INTRAVENOUS

## 2021-08-25 ENCOUNTER — Other Ambulatory Visit: Payer: Self-pay | Admitting: Internal Medicine

## 2021-08-25 ENCOUNTER — Ambulatory Visit
Admission: RE | Admit: 2021-08-25 | Discharge: 2021-08-25 | Disposition: A | Discharge status: Home / Self Care | Payer: BC Managed Care – PPO | Source: Ambulatory Visit | Attending: Internal Medicine | Admitting: Internal Medicine

## 2021-08-25 DIAGNOSIS — R1031 Right lower quadrant pain: Secondary | ICD-10-CM

## 2021-08-25 MED ORDER — IOPAMIDOL (ISOVUE-300) INJECTION 61%
100.0000 mL | Freq: Once | INTRAVENOUS | Status: AC | PRN
  Administered 2021-08-25: 100 mL via INTRAVENOUS

## 2021-09-01 ENCOUNTER — Telehealth: Payer: Self-pay | Admitting: Physician Assistant

## 2021-09-01 NOTE — Telephone Encounter (Signed)
Scheduled appt per 11/14 referral. Pt is aware of appt date and time.  

## 2021-09-23 ENCOUNTER — Other Ambulatory Visit: Payer: Self-pay | Admitting: Internal Medicine

## 2021-09-23 DIAGNOSIS — E785 Hyperlipidemia, unspecified: Secondary | ICD-10-CM

## 2021-10-07 ENCOUNTER — Other Ambulatory Visit: Payer: BC Managed Care – PPO

## 2021-10-07 ENCOUNTER — Encounter: Payer: BC Managed Care – PPO | Admitting: Physician Assistant

## 2021-10-27 ENCOUNTER — Other Ambulatory Visit: Payer: BC Managed Care – PPO

## 2022-08-23 IMAGING — MR MR LUMBAR SPINE WO/W CM
4 of 7 series · 28 of 48 positions shown · IV contrast (gadavist)
Comparison: None.

CLINICAL DATA: Acute right-sided thoracic back pain radiating to
right groin 2 weeks no trauma

EXAM:
MRI THORACIC AND LUMBAR SPINE WITHOUT AND WITH CONTRAST
TECHNIQUE: Multiplanar and multiecho pulse sequences of the thoracic and lumbar
spine were obtained without and with intravenous contrast.
CONTRAST:  8.5mL GADAVIST GADOBUTROL 1 MMOL/ML IV SOLN

[Series 16: T1 · sagittal · 4.0mm · 0.81mm/px · 3 of 15 slices shown (1 of 2)]
[im 1/15]
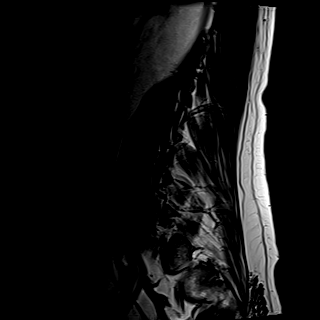
[im 8/15]
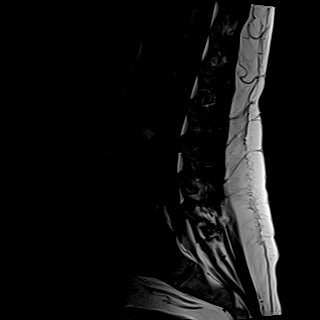
[im 15/15]
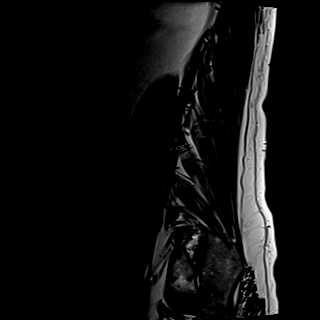

[Series 17: T2 · sagittal · 4.0mm · 0.81mm/px · 4 of 15 slices shown (1 of 2)]
[im 1/15]
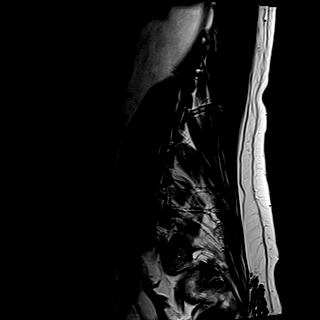
[im 5/15]
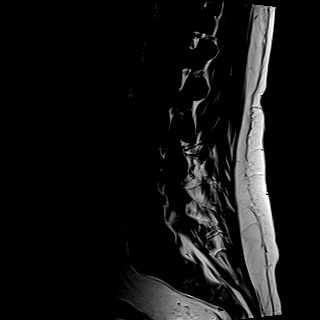
[im 10/15]
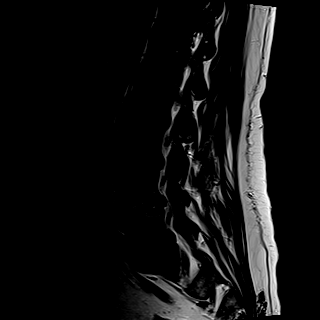
[im 15/15]
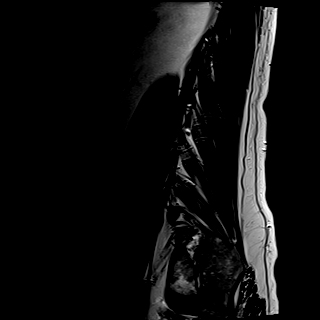

[Series 19: T2 · axial · 4.0mm · 0.62mm/px · z∈[-545,-321]mm · 11 of 43 slices shown (2 of 2)]
[im 1/43]
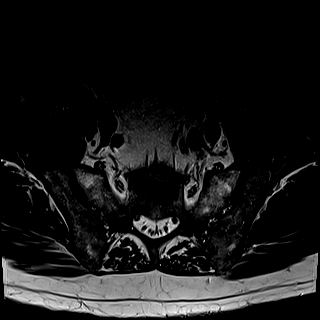
[im 5/43]
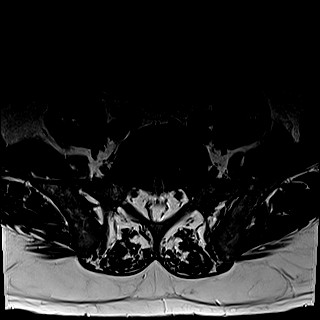
[im 9/43]
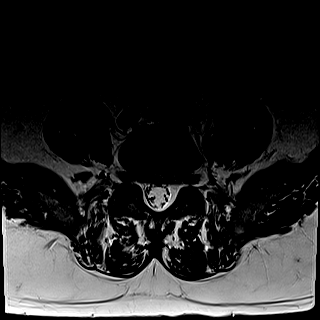
[im 13/43]
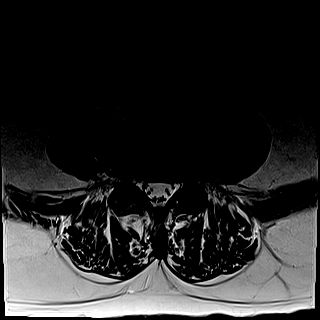
[im 17/43]
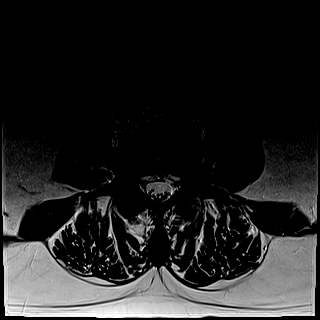
[im 22/43]
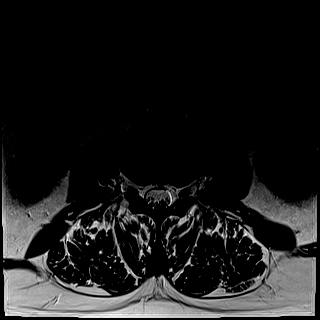
[im 26/43]
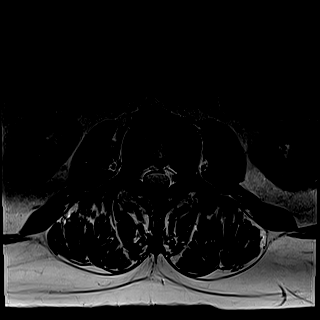
[im 30/43]
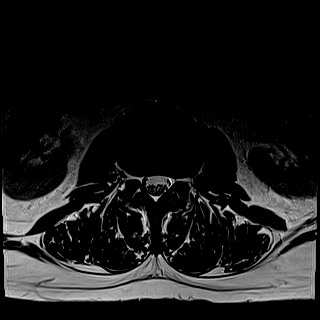
[im 34/43]
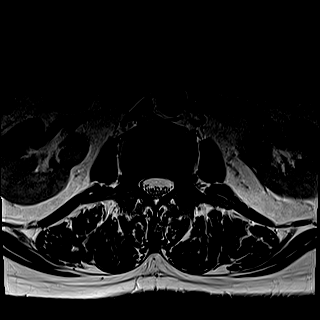
[im 38/43]
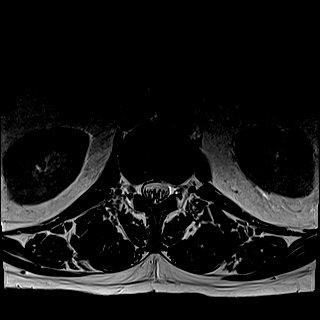
[im 43/43]
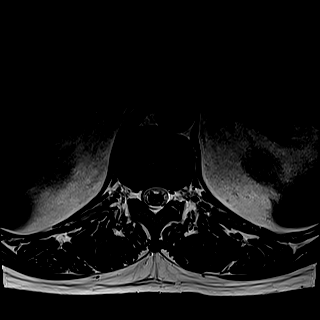

[Series 20: T1 · axial · 4.0mm · 0.39mm/px · z∈[-545,-346]mm · 10 of 43 slices shown (2 of 2)]
[im 1/43]
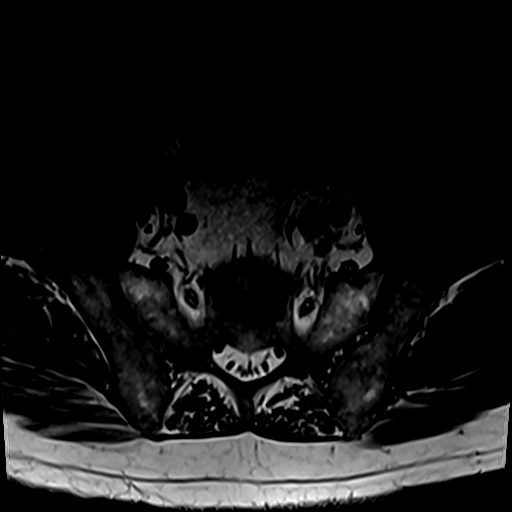
[im 5/43]
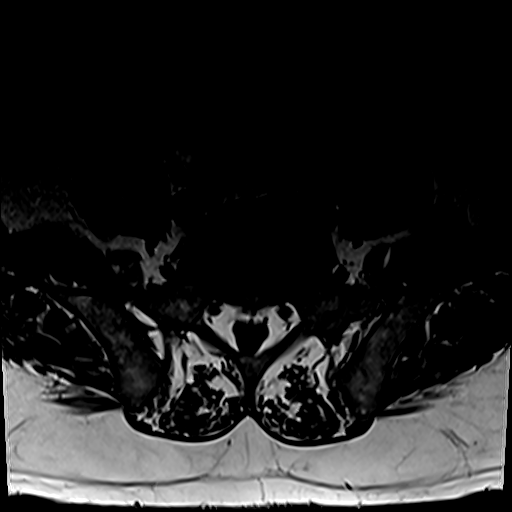
[im 9/43]
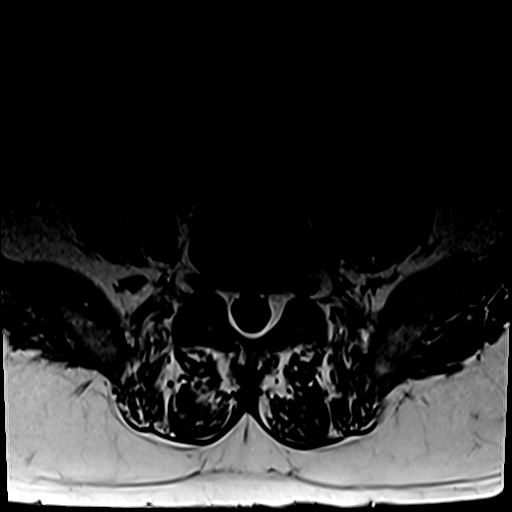
[im 13/43]
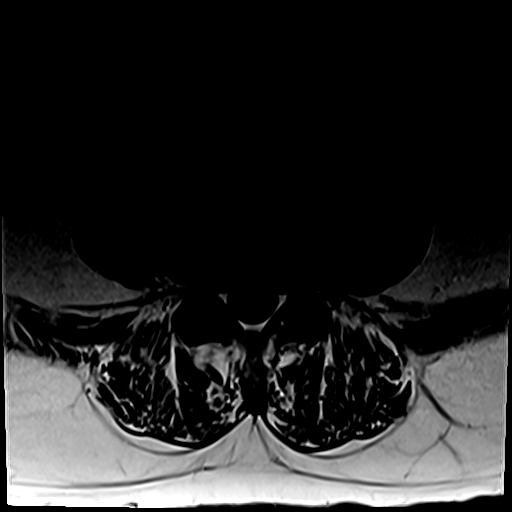
[im 17/43]
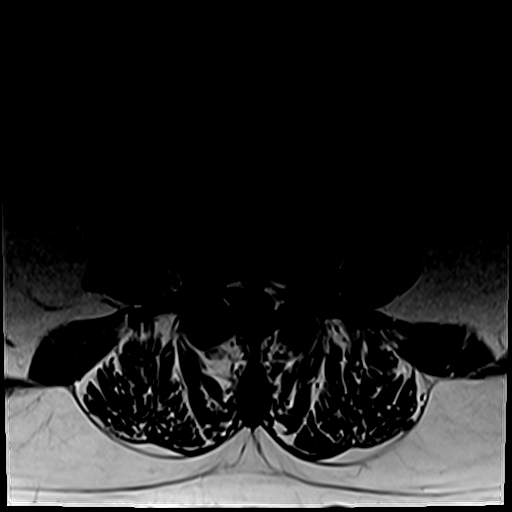
[im 22/43]
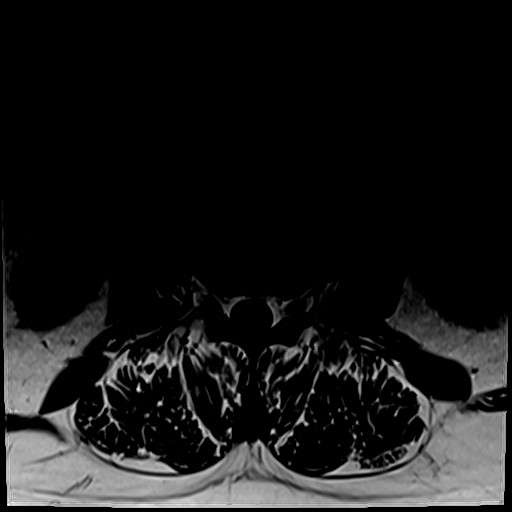
[im 26/43]
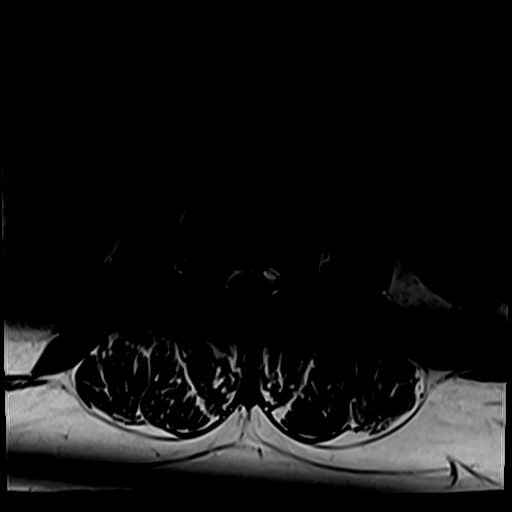
[im 30/43]
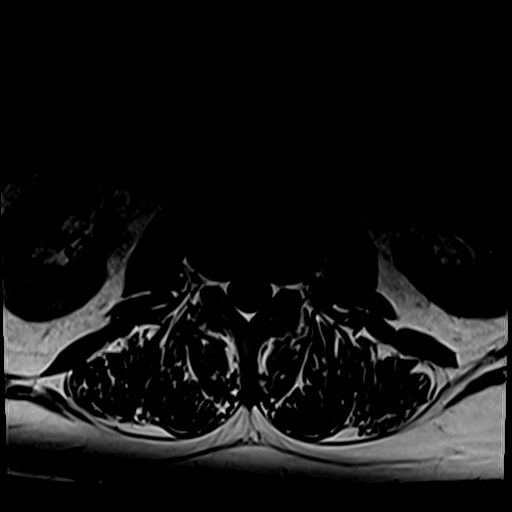
[im 34/43]
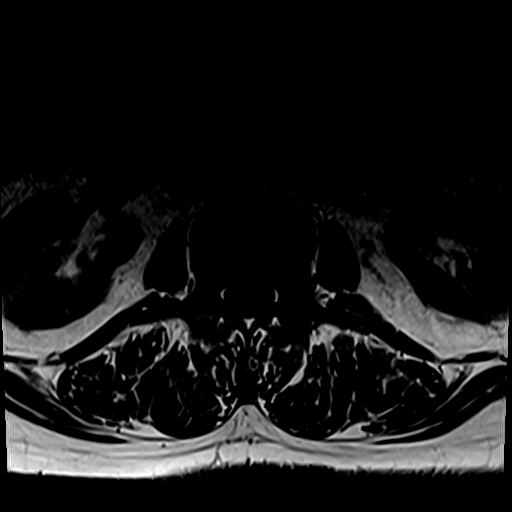
[im 38/43]
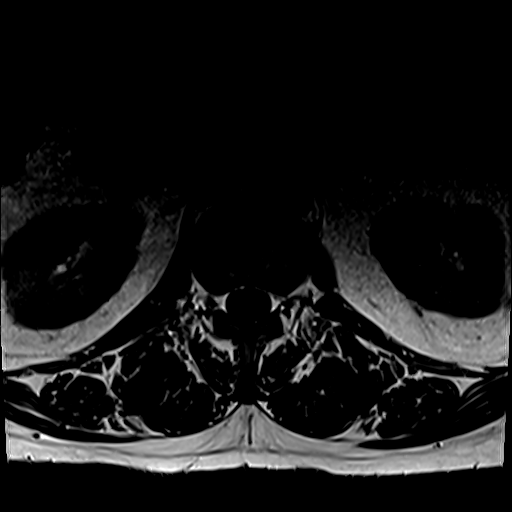

[28 of 48 positions shown; findings below may reference images not displayed]

FINDINGS: MRI THORACIC SPINE FINDINGS

Alignment:  Normal.

Vertebrae: There is a 2.0 cm focus of T1/T2 hyperintensity in the
T10 vertebral body likely reflecting an intraosseous hemangioma.
There is no suspicious marrow signal abnormality. Vertebral body
heights are preserved. There is no abnormal marrow enhancement.

Cord: Normal in signal and morphology. There is no abnormal
enhancement.

Paraspinal and other soft tissues: The paraspinal soft tissues are
unremarkable.

Disc levels:

There is mild multilevel disc desiccation and narrowing throughout
the thoracic spine. There is a small left foraminal disc protrusion
at T5-T6 without significant spinal canal or neural foraminal
stenosis, and no evidence of impingement of the exiting nerve root.
There is no other significant disc herniation, and no significant
spinal canal or neural foraminal stenosis.

MRI LUMBAR SPINE FINDINGS

Segmentation:  Standard.

Alignment:  Normal.

Vertebrae: There is a small intraosseous hemangioma in the L3
vertebral body. There is degenerative endplate marrow signal
abnormality at L4-L5. There is no suspicious marrow signal
abnormality. There is no abnormal marrow enhancement.

Conus medullaris: Extends to the L1-L2 level and appears normal.
There is no abnormal enhancement.

Paraspinal and other soft tissues: The paraspinal soft tissues are
unremarkable.

Disc levels:

There is advanced disc desiccation and narrowing at L4-L5 and more
mild degenerative disc disease at the remaining levels. There is
mild multilevel facet arthropathy.

T12-L1: No significant spinal canal or neural foraminal stenosis.

L1-L2: No significant spinal canal or neural foraminal stenosis.

L2-L3: There is a small right subarticular zone disc protrusion
without significant spinal canal or neural foraminal stenosis. No
evidence of nerve root impingement in the subarticular zone.

L3-L4: There is a mild disc bulge, degenerative endplate change, and
mild bilateral facet arthropathy without significant spinal canal or
neural foraminal stenosis.

L4-L5: There is a mild disc bulge, degenerative endplate change, and
bilateral facet arthropathy resulting in mild bilateral neural
foraminal stenosis without significant spinal canal stenosis. No
evidence of nerve root impingement.

L5-S1: There is a mild diffuse disc bulge, degenerative endplate
change, and mild bilateral facet arthropathy resulting in mild
bilateral neural foraminal stenosis with possible contact of the
exiting left nerve root by foraminal disc material. There is no
significant spinal canal stenosis.
IMPRESSION: 1. Mild degenerative changes in the thoracic spine as above. No
significant spinal canal or neural foraminal stenosis.
2. Mild degenerative changes in the lumbar spine as above resulting
in up to mild bilateral neural foraminal stenosis at L4-L5 and L5-S1
with possible contact of the exiting left L5 nerve root by foraminal
disc material. Otherwise, no high-grade spinal canal or neural
foraminal stenosis, and no other evidence of nerve root impingement.
3. Intervertebral disc space narrowing most advanced at L4-L5. Mild
multilevel facet arthropathy.

## 2022-10-14 ENCOUNTER — Other Ambulatory Visit: Payer: Self-pay | Admitting: Internal Medicine

## 2022-10-14 DIAGNOSIS — E785 Hyperlipidemia, unspecified: Secondary | ICD-10-CM

## 2022-11-13 ENCOUNTER — Inpatient Hospital Stay: Admission: RE | Admit: 2022-11-13 | Payer: BC Managed Care – PPO | Source: Ambulatory Visit

## 2024-10-17 ENCOUNTER — Ambulatory Visit (HOSPITAL_COMMUNITY)
Admission: RE | Admit: 2024-10-17 | Discharge: 2024-10-17 | Disposition: A | Discharge status: Home / Self Care | Source: Ambulatory Visit | Attending: Vascular Surgery | Admitting: Vascular Surgery

## 2024-10-17 ENCOUNTER — Other Ambulatory Visit (HOSPITAL_COMMUNITY): Payer: Self-pay | Admitting: Family Medicine

## 2024-10-17 DIAGNOSIS — M7989 Other specified soft tissue disorders: Secondary | ICD-10-CM | POA: Diagnosis not present

## 2024-10-17 DIAGNOSIS — M79604 Pain in right leg: Secondary | ICD-10-CM | POA: Insufficient documentation
